# Patient Record
Sex: Female | Born: 1960 | Race: White | Hispanic: No | Marital: Married | Smoking: Current every day smoker
Health system: Southern US, Community
[De-identification: ages and names within clinical notes are randomized; demographics above are authoritative.]

---
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
## 2013-02-04 ENCOUNTER — Ambulatory Visit: Payer: Self-pay

## 2015-01-19 ENCOUNTER — Ambulatory Visit (INDEPENDENT_AMBULATORY_CARE_PROVIDER_SITE_OTHER): Payer: BLUE CROSS/BLUE SHIELD | Admitting: Family Medicine

## 2015-01-19 ENCOUNTER — Encounter: Payer: Self-pay | Admitting: Family Medicine

## 2015-01-19 VITALS — BP 124/72 | HR 62 | Temp 98.0°F | Resp 16 | Ht 62.5 in | Wt 162.8 lb

## 2015-01-19 DIAGNOSIS — J069 Acute upper respiratory infection, unspecified: Secondary | ICD-10-CM | POA: Diagnosis not present

## 2015-01-19 LAB — POCT RAPID STREP A (OFFICE): Rapid Strep A Screen: NEGATIVE

## 2015-01-19 NOTE — Patient Instructions (Signed)
Discussed use of Mucinex D for congestion, Delsym for cough, and Benadryl for postnasal drainage 

## 2015-01-19 NOTE — Progress Notes (Signed)
Subjective:     Patient ID: Raven Clark, female   DOB: 1960-07-03, 54 y.o.   MRN: [id number]  HPI  Chief Complaint  Patient presents with  . Sore Throat    Patient comes in office today with concerns of sore throat since 8/27. Patient states that pain is mostly on right side of throat more than left, patient reports pain with swallowing. Associated symptoms include: cough, stuffy nose and pain in right ear. Patient reports taking otc Sudafed and nettie pot  States her grandchildren are not ill at this time and is accompanied by her granddaughter.   Review of Systems  Constitutional: Negative for fever and chills.       Objective:   Physical Exam  Constitutional: She appears well-developed and well-nourished. No distress.  Ears: T.M's intact without inflammation Sinuses: non-tender Throat: miild tonsillar enlargement; no exudate Neck: no cervical adenopathy but mild tenderness Lungs: clear     Assessment:    1. Upper respiratory infection - POCT rapid strep A    Plan:    Discussed use of Mucinex D for congestion, Delsym for cough, and Benadryl for postnasal drainage

## 2015-02-16 ENCOUNTER — Ambulatory Visit: Payer: BLUE CROSS/BLUE SHIELD | Admitting: Family Medicine

## 2015-02-17 ENCOUNTER — Encounter: Payer: Self-pay | Admitting: Family Medicine

## 2015-02-17 ENCOUNTER — Ambulatory Visit (INDEPENDENT_AMBULATORY_CARE_PROVIDER_SITE_OTHER): Payer: BLUE CROSS/BLUE SHIELD | Admitting: Family Medicine

## 2015-02-17 VITALS — BP 132/84 | HR 66 | Temp 97.4°F | Resp 16 | Wt 163.6 lb

## 2015-02-17 DIAGNOSIS — J069 Acute upper respiratory infection, unspecified: Secondary | ICD-10-CM

## 2015-02-17 MED ORDER — CEFDINIR 300 MG PO CAPS: 300.0000 mg | ORAL_CAPSULE | Freq: Two times a day (BID) | ORAL | Status: DC

## 2015-02-17 NOTE — Patient Instructions (Signed)
Discussed use of Mucinex D, Delsym for cough, and Benadryl at night for congestion. If cough not improving over the next few days with increased sputum production, shortness of breath, or wheezing -start antibiotic

## 2015-02-17 NOTE — Progress Notes (Signed)
Subjective:     Patient ID: Raven Clark, female   DOB: 01/09/1961, 54 y.o.   MRN: [id number]  HPI  Chief Complaint  Patient presents with  . Cough    Patient comes in office today with concerns of cough and congestion for the past week. Patient states that cough has been productive of mucous and she has been taking otc Day/Night Quil. Associated symptoms include: sore throat, fatigue and low grade fever <100  States prior URI sx had resolved from earlier this month. Reports one of her young grandchildren was ill and slept with her prior to the onset of her current sx. Has not stopped smoking during this period of illness.    Review of Systems  HENT:       Clear sinus drainage without sinus pressure  Respiratory: Positive for shortness of breath (mild).        Objective:   Physical Exam  Constitutional: She appears well-developed and well-nourished. No distress.  Ears: T.M's intact without inflammation Throat: moderate tonsillar enlargement or exudate Neck: no cervical adenopathy Lungs: Basilar coarse crackles which clear with cough     Assessment:    1. Upper respiratory infection - cefdinir (OMNICEF) 300 MG capsule; Take 1 capsule (300 mg total) by mouth 2 (two) times daily.  Dispense: 20 capsule; Refill: 0    Plan:    Discussed use of Mucinex D for congestion, Delsym for cough, and Benadryl for postnasal drainage. To start abx if cough not improving over the next few days.

## 2015-02-18 ENCOUNTER — Ambulatory Visit: Payer: BLUE CROSS/BLUE SHIELD | Admitting: Family Medicine

## 2015-04-13 ENCOUNTER — Ambulatory Visit (INDEPENDENT_AMBULATORY_CARE_PROVIDER_SITE_OTHER): Payer: BLUE CROSS/BLUE SHIELD | Admitting: Family Medicine

## 2015-04-13 ENCOUNTER — Encounter: Payer: Self-pay | Admitting: Family Medicine

## 2015-04-13 DIAGNOSIS — J209 Acute bronchitis, unspecified: Secondary | ICD-10-CM | POA: Diagnosis not present

## 2015-04-13 DIAGNOSIS — R062 Wheezing: Secondary | ICD-10-CM | POA: Diagnosis not present

## 2015-04-13 MED ORDER — PREDNISONE 5 MG PO TABS: 5.0000 mg | ORAL_TABLET | Freq: Every day | ORAL | Status: DC

## 2015-04-13 MED ORDER — AZITHROMYCIN 250 MG PO TABS: ORAL_TABLET | ORAL | Status: DC

## 2015-04-13 MED ORDER — ALBUTEROL SULFATE HFA 108 (90 BASE) MCG/ACT IN AERS: 2.0000 | INHALATION_SPRAY | Freq: Four times a day (QID) | RESPIRATORY_TRACT | Status: DC | PRN

## 2015-04-13 MED ORDER — LEVALBUTEROL HCL 1.25 MG/0.5ML IN NEBU
1.2500 mg | INHALATION_SOLUTION | Freq: Once | RESPIRATORY_TRACT | Status: AC
  Administered 2015-04-13: 1.25 mg via RESPIRATORY_TRACT

## 2015-04-13 NOTE — Progress Notes (Signed)
Patient ID: Raven Clark, female   DOB: [birth year], 54 y.o.   MRN: [id number] Name: MILDA LINDVALL   MRN: [id number]    DOB: 02-21-1961   Date:04/13/2015       Progress Note  Subjective  Chief Complaint  Chief Complaint  Patient presents with  . Cough  . Fever    Cough This is a recurrent problem. The current episode started 1 to 4 weeks ago (approximately 2 weeks). The problem has been gradually worsening. The problem occurs constantly. Cough characteristics: occasional clear sputum. Associated symptoms include chest pain, a fever and rhinorrhea. Pertinent negatives include no ear pain, postnasal drip or sore throat. Associated symptoms comments: Sore ribs.. Risk factors for lung disease include smoking/tobacco exposure. She has tried OTC cough suppressant (hydrocodone - but causes headache) for the symptoms. The treatment provided mild relief.  Fever  The current episode started in the past 7 days. The problem occurs intermittently. Her temperature was unmeasured prior to arrival. Associated symptoms include chest pain and coughing. Pertinent negatives include no ear pain or sore throat. She has tried acetaminophen for the symptoms.   Past Surgical History  Procedure Laterality Date  . Foot surgery Right 1971   Family History  Problem Relation Age of Onset  . Hypertension Mother   . Dementia Father   . Parkinson's disease Father    History reviewed. No pertinent past medical history.  Social History  Substance Use Topics  . Smoking status: Current Every Day Smoker    Types: Cigarettes  . Smokeless tobacco: Not on file  . Alcohol Use: 0.6 oz/week    1 Standard drinks or equivalent per week   No current outpatient prescriptions on file.  Allergies  Allergen Reactions  . Tetracycline     brain swelling as an infant   Review of Systems  Constitutional: Positive for fever.  HENT: Positive for rhinorrhea. Negative for ear pain, postnasal drip and sore throat.   Eyes:  Negative.   Respiratory: Positive for cough.   Cardiovascular: Positive for chest pain.  Gastrointestinal: Negative.   Genitourinary: Negative.   Skin: Negative.   Neurological: Negative.   Endo/Heme/Allergies: Negative.   Psychiatric/Behavioral: Negative.    Objective  Filed Vitals:   04/13/15 1142  BP: 122/64  Pulse: 95  Temp: 98.7 F (37.1 C)  TempSrc: Oral  Resp: 18  Weight: 162 lb 3.2 oz (73.573 kg)  SpO2: 95%   Physical Exam  Constitutional: She is oriented to person, place, and time and well-developed, well-nourished, and in no distress.  HENT:  Head: Normocephalic and atraumatic.  Right Ear: External ear normal.  Left Ear: External ear normal.  Large tonsils but no redness or exudates.  Eyes: Conjunctivae and EOM are normal.  Neck: Normal range of motion. Neck supple.  Cardiovascular: Normal rate, regular rhythm and normal heart sounds.   Pulmonary/Chest: She has wheezes. She exhibits tenderness.  Rhonchi scattered.  Abdominal: Soft. Bowel sounds are normal.  Neurological: She is alert and oriented to person, place, and time.  Skin: No rash noted.  Psychiatric: Affect and judgment normal.    Assessment & Plan  1. Acute bronchitis, unspecified organism Onset with cough and congestion over the past 2 weeks with the onset of fever a week ago. Wheeze and rhonchi in chest to auscultation. Given nebulizer treatment with Xopenex and started prednisone with Z-pak. Increase fluid intake and may continue Mucinex with Delsym prn cough. Recheck in 5-7 days if no better. - predniSONE (DELTASONE) 5 MG  tablet; Take 1 tablet (5 mg total) by mouth daily with breakfast. Taper dose daily over the next 6 days (6,5,4,3,2,1)  Dispense: 21 tablet; Refill: 0 - azithromycin (ZITHROMAX) 250 MG tablet; Two tablets today by mouth then one daily for 4 days.  Dispense: 6 tablet; Refill: 0 - levalbuterol (XOPENEX) nebulizer solution 1.25 mg; Take 1.25 mg by nebulization once.  2.  Wheeze Scattered and improved with nebulizer treatment. Will add prednisone taper and Proventil prn rescue from wheezing. Should go to ER if worsening and not controlled by this regimen. - albuterol (PROVENTIL HFA;VENTOLIN HFA) 108 (90 BASE) MCG/ACT inhaler; Inhale 2 puffs into the lungs every 6 (six) hours as needed for wheezing or shortness of breath.  Dispense: 1 Inhaler; Refill: 2 - levalbuterol (XOPENEX) nebulizer solution 1.25 mg; Take 1.25 mg by nebulization once.

## 2016-03-23 ENCOUNTER — Encounter: Payer: Self-pay | Admitting: Family Medicine

## 2016-03-23 ENCOUNTER — Ambulatory Visit (INDEPENDENT_AMBULATORY_CARE_PROVIDER_SITE_OTHER): Payer: BLUE CROSS/BLUE SHIELD | Admitting: Family Medicine

## 2016-03-23 VITALS — BP 102/70 | HR 77 | Temp 98.4°F | Resp 17 | Wt 162.0 lb

## 2016-03-23 DIAGNOSIS — B349 Viral infection, unspecified: Secondary | ICD-10-CM

## 2016-03-23 MED ORDER — CEFDINIR 300 MG PO CAPS: 300.0000 mg | ORAL_CAPSULE | Freq: Two times a day (BID) | ORAL | 0 refills | Status: DC

## 2016-03-23 MED ORDER — ALBUTEROL SULFATE HFA 108 (90 BASE) MCG/ACT IN AERS: 2.0000 | INHALATION_SPRAY | Freq: Four times a day (QID) | RESPIRATORY_TRACT | 2 refills | Status: DC | PRN

## 2016-03-23 NOTE — Progress Notes (Signed)
Subjective:     Patient ID: Jackqulyn LivingsJennifer L Chalfin, female   DOB: 1960-10-21, 55 y.o.   MRN: [id number]  HPI  Chief Complaint  Patient presents with  . Cough    Patient comes in office today with concerns of cough and congestion for the past 2.5 weeks. Patient reports following symptoms: wheezing, runny nose, pain in joints, sinus pressure right side only amd fatigue. Paitent reports taking otc Xicam and Vicks, last night paitent states she took an old Rx for Hydrocodone.   Reports waxing and waning symptoms with clear sinus congestion and sputum She has diminished smoking while ill. States she has left over narcotic cough syrup at home. She takes care of her young grandchildren 4 x week. Has previously used albuterol MDI when ill.   Review of Systems     Objective:   Physical Exam  Constitutional: She appears well-developed and well-nourished. She appears distressed (mild distress from frequent congested cough.).  Ears: T.M's intact without inflammation Sinuses: non-tender Throat: moderate tonsillar enlargement without erythema or exudate Neck: anterior cervical tenderness. Lungs: clear/ deep inspiration provokes cough     Assessment:    1. Acute viral syndrome - cefdinir (OMNICEF) 300 MG capsule; Take 1 capsule (300 mg total) by mouth 2 (two) times daily.  Dispense: 14 capsule; Refill: 0 - albuterol (PROVENTIL HFA;VENTOLIN HFA) 108 (90 Base) MCG/ACT inhaler; Inhale 2 puffs into the lungs every 6 (six) hours as needed for wheezing or shortness of breath.  Dispense: 1 Inhaler; Refill: 2    Plan:    Discussed use of Mucinex D and Delsym. Schedule albuterol MDI. If not improving over the next two days or sees purulent sputum to start abx.

## 2016-03-23 NOTE — Patient Instructions (Addendum)
Discussed use of Mucinex D, Delsym and minimize smoking while ill. Schedule albuterol at least twice daily while ill.

## 2016-03-31 ENCOUNTER — Ambulatory Visit (INDEPENDENT_AMBULATORY_CARE_PROVIDER_SITE_OTHER): Payer: BLUE CROSS/BLUE SHIELD | Admitting: Family Medicine

## 2016-03-31 ENCOUNTER — Encounter: Payer: Self-pay | Admitting: Family Medicine

## 2016-03-31 VITALS — BP 118/82 | HR 72 | Temp 97.9°F | Resp 16 | Wt 165.0 lb

## 2016-03-31 DIAGNOSIS — S80862A Insect bite (nonvenomous), left lower leg, initial encounter: Secondary | ICD-10-CM

## 2016-03-31 DIAGNOSIS — W57XXXA Bitten or stung by nonvenomous insect and other nonvenomous arthropods, initial encounter: Secondary | ICD-10-CM | POA: Diagnosis not present

## 2016-03-31 NOTE — Progress Notes (Signed)
Subjective:     Patient ID: Raven Clark, female   DOB: 02-12-1961, 55 y.o.   MRN: [id number]  HPI  Chief Complaint  Patient presents with  . Insect Bite    Pt found tick on her left leg on Monday. Tick came off of dog. Pt states, "it wasn't a deer tick, it was a dog tick". Pt reports she has a "target" on her leg. Pt denies fever, joint aches. Does admit to headache.  States she used vaseline then removed the tick with tweezers. States it have been on her < 24 hours. Reports sinus infection has improved with use of abx. She is accompanied by her two grandchildren today.   Review of Systems     Objective:   Physical Exam  Constitutional: She appears well-developed and well-nourished. No distress.  Skin:  Left lower leg with 1 cm, flat, annular area of erythema c/w hemorrhage from tick removal. No tenderness, drainage, or foreign bodies visualized.       Assessment:    1. Tick bite of left lower leg, initial encounter     Plan:    Cleanse with soap and water and apply hydrocortisone as needed for itching. Discussed s & s of tick fever.

## 2016-03-31 NOTE — Patient Instructions (Signed)
Clean with soap and water daily. May use hydrocortisone cream for itching.

## 2017-06-07 ENCOUNTER — Other Ambulatory Visit: Payer: Self-pay

## 2017-06-07 ENCOUNTER — Emergency Department: Payer: No Typology Code available for payment source

## 2017-06-07 ENCOUNTER — Emergency Department
Admission: EM | Admit: 2017-06-07 | Discharge: 2017-06-07 | Disposition: A | Discharge status: Home / Self Care | Payer: No Typology Code available for payment source | Attending: Emergency Medicine | Admitting: Emergency Medicine

## 2017-06-07 DIAGNOSIS — Y9389 Activity, other specified: Secondary | ICD-10-CM | POA: Diagnosis not present

## 2017-06-07 DIAGNOSIS — F17210 Nicotine dependence, cigarettes, uncomplicated: Secondary | ICD-10-CM | POA: Diagnosis not present

## 2017-06-07 DIAGNOSIS — S29012A Strain of muscle and tendon of back wall of thorax, initial encounter: Secondary | ICD-10-CM | POA: Diagnosis not present

## 2017-06-07 DIAGNOSIS — Y92410 Unspecified street and highway as the place of occurrence of the external cause: Secondary | ICD-10-CM | POA: Diagnosis not present

## 2017-06-07 DIAGNOSIS — S299XXA Unspecified injury of thorax, initial encounter: Secondary | ICD-10-CM | POA: Diagnosis present

## 2017-06-07 DIAGNOSIS — M25572 Pain in left ankle and joints of left foot: Secondary | ICD-10-CM | POA: Insufficient documentation

## 2017-06-07 DIAGNOSIS — Y999 Unspecified external cause status: Secondary | ICD-10-CM | POA: Diagnosis not present

## 2017-06-07 DIAGNOSIS — S8011XA Contusion of right lower leg, initial encounter: Secondary | ICD-10-CM | POA: Insufficient documentation

## 2017-06-07 DIAGNOSIS — S29019A Strain of muscle and tendon of unspecified wall of thorax, initial encounter: Secondary | ICD-10-CM

## 2017-06-07 MED ORDER — IBUPROFEN 600 MG PO TABS
ORAL_TABLET | ORAL | Status: AC
  Filled 2017-06-07: qty 1

## 2017-06-07 MED ORDER — CYCLOBENZAPRINE HCL 5 MG PO TABS: 5.0000 mg | ORAL_TABLET | Freq: Three times a day (TID) | ORAL | 0 refills | Status: DC | PRN

## 2017-06-07 MED ORDER — IBUPROFEN 600 MG PO TABS
600.0000 mg | ORAL_TABLET | Freq: Once | ORAL | Status: AC
  Administered 2017-06-07: 600 mg via ORAL
  Filled 2017-06-07: qty 1
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
## 2017-06-07 NOTE — ED Notes (Signed)
Patient transported to X-ray 
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
## 2017-11-14 ENCOUNTER — Ambulatory Visit
Admission: RE | Admit: 2017-11-14 | Discharge: 2017-11-14 | Disposition: A | Discharge status: Home / Self Care | Payer: 59 | Source: Ambulatory Visit | Attending: Family Medicine | Admitting: Family Medicine

## 2017-11-14 ENCOUNTER — Other Ambulatory Visit: Payer: Self-pay | Admitting: Family Medicine

## 2017-11-14 ENCOUNTER — Ambulatory Visit: Payer: 59 | Admitting: Family Medicine

## 2017-11-14 ENCOUNTER — Encounter: Payer: Self-pay | Admitting: Family Medicine

## 2017-11-14 VITALS — BP 120/76 | HR 100 | Temp 100.0°F | Resp 18 | Wt 147.4 lb

## 2017-11-14 DIAGNOSIS — J209 Acute bronchitis, unspecified: Secondary | ICD-10-CM | POA: Diagnosis not present

## 2017-11-14 DIAGNOSIS — R509 Fever, unspecified: Secondary | ICD-10-CM | POA: Insufficient documentation

## 2017-11-14 DIAGNOSIS — R918 Other nonspecific abnormal finding of lung field: Secondary | ICD-10-CM | POA: Insufficient documentation

## 2017-11-14 DIAGNOSIS — R05 Cough: Secondary | ICD-10-CM | POA: Insufficient documentation

## 2017-11-14 MED ORDER — AZITHROMYCIN 250 MG PO TABS: ORAL_TABLET | ORAL | 0 refills | Status: DC

## 2017-11-14 MED ORDER — PREDNISONE 20 MG PO TABS: ORAL_TABLET | ORAL | 0 refills | Status: DC

## 2017-11-14 NOTE — Patient Instructions (Signed)
We will call you with the x-ray report. Continue albuterol and expectorant.

## 2017-11-14 NOTE — Progress Notes (Signed)
  Subjective:     Patient ID: Raven Clark, female   DOB: 27-Apr-1961, 57 y.o.   MRN: [id number] Chief Complaint  Patient presents with  . Cough    Patient comes in office today with complaints of cough and congestion for one week. Pateint complaints of fever high of 100.6, body/muscle aches, cough fproductive of mucous, fullness in left ear, shortness of breath, wheezing and decreased appetite. Patient reports that she is taking otc Delsym 12hr, Advil, Tylenol and Sudafed; patient reports that she has used Proventil for shortness of breath.    HPI Reports increased congested cough and persistent fevers. Not able to characterize her sputum. Sinus drainage is clear. Accompanied by her two granddaughters. + tobacco use. Patient used albuterol prior to visit today. Review of Systems     Objective:   Physical Exam  Constitutional: She appears well-developed and well-nourished. She appears distressed (mild distress from frequent cough).  Ears: T.M's intact without inflammation Throat: moderate tonsillar enlargement without exudate Neck: no cervical adenopathy Lungs: posterior expiratory crackles with inspiration provoking cough.     Assessment:    1. Acute bronchitis, unspecified organism: r/o pneumonia: start Z-Pack - DG Chest 2 View; Future    Plan:    Further f/u pending CXR report. Continue albuterol and expectorant.

## 2017-11-15 ENCOUNTER — Telehealth: Payer: Self-pay

## 2017-11-15 NOTE — Telephone Encounter (Signed)
-----   Message from Anola Gurneyobert Chauvin, [place] sent at 11/15/2017  7:27 AM EDT ----- Probable left lower lower lobe pneumonia. Will need to call for follow up chest x-ray in 3-4 weeks or sooner if not improving.

## 2017-11-15 NOTE — Telephone Encounter (Signed)
Patient advised.KW 

## 2018-06-28 ENCOUNTER — Other Ambulatory Visit: Payer: Self-pay

## 2018-06-28 ENCOUNTER — Other Ambulatory Visit: Payer: Self-pay | Admitting: Family Medicine

## 2018-06-28 ENCOUNTER — Ambulatory Visit (INDEPENDENT_AMBULATORY_CARE_PROVIDER_SITE_OTHER): Payer: 59 | Admitting: Family Medicine

## 2018-06-28 ENCOUNTER — Encounter: Payer: Self-pay | Admitting: Family Medicine

## 2018-06-28 VITALS — BP 142/90 | HR 92 | Temp 99.7°F | Ht 62.5 in | Wt 150.2 lb

## 2018-06-28 DIAGNOSIS — B349 Viral infection, unspecified: Secondary | ICD-10-CM

## 2018-06-28 DIAGNOSIS — Z20828 Contact with and (suspected) exposure to other viral communicable diseases: Secondary | ICD-10-CM

## 2018-06-28 LAB — POCT INFLUENZA A/B
Influenza A, POC: NEGATIVE
Influenza B, POC: NEGATIVE

## 2018-06-28 MED ORDER — OSELTAMIVIR PHOSPHATE 75 MG PO CAPS: 75.0000 mg | ORAL_CAPSULE | Freq: Two times a day (BID) | ORAL | 0 refills | Status: DC

## 2018-06-28 NOTE — Progress Notes (Signed)
  Subjective:     Patient ID: PALMIRA HIRSCHI, female   DOB: 21-May-1961, 58 y.o.   MRN: [id number] Chief Complaint  Patient presents with  . flu like symptoms    fever, chills, no appitite, coughing, sinus drainage, since 06/27/18   HPI Reports household exposure to the flu and now has flu sx with temp up to 102. No flu shot. + tobacco use. Accompanied by her mother today.  Review of Systems     Objective:   Physical Exam Constitutional:      General: She is not in acute distress.    Appearance: She is ill-appearing.  Neurological:     Mental Status: She is alert.   Ears: T.M's intact without inflammation Throat: mild tonsillar enlargement without erythema or exudate. Neck: no cervical adenopathy Lungs: clear     Assessment:    1. Exposure to the flu - POCT Influenza A/B - oseltamivir (TAMIFLU) 75 MG capsule; Take 1 capsule (75 mg total) by mouth 2 (two) times daily.  Dispense: 10 capsule; Refill: 0  2. Acute viral syndrome    Plan:    Discussed use of Mucinex and Delsym. Stop smoking while ill.

## 2018-06-28 NOTE — Patient Instructions (Addendum)
Discussed use of Mucinex and Delsym. No smoking while ill.

## 2019-01-24 ENCOUNTER — Telehealth: Payer: Self-pay | Admitting: Family Medicine

## 2019-01-24 NOTE — Telephone Encounter (Signed)
I can work her in this afternoon at 4 or earlier if she can get here.  Otherwise, we can check and see if Tawanna Sat could squeeze it in tomorrow

## 2019-01-24 NOTE — Telephone Encounter (Signed)
Patient called stating she has "red nodules that are big as dimes popping up behind her ear, in front of her ear, at nostril, on forehead. There is one on her eyelid that has almost swollen eye shut.  She thought at first that she was bit by something but they are continuing to "come out" periodically.    Not sure where to put her for an appt.    Please advise patient. She said she cannot come today.

## 2019-01-24 NOTE — Telephone Encounter (Signed)
Patient can not come in today. Raven Clark said she can work her in at 11:20 tomorrow.

## 2019-01-25 ENCOUNTER — Encounter: Payer: Self-pay | Admitting: Physician Assistant

## 2019-01-25 ENCOUNTER — Ambulatory Visit: Payer: BLUE CROSS/BLUE SHIELD | Admitting: Physician Assistant

## 2019-01-25 ENCOUNTER — Other Ambulatory Visit: Payer: Self-pay

## 2019-01-25 VITALS — BP 153/88 | HR 64 | Temp 97.1°F | Wt 156.0 lb

## 2019-01-25 DIAGNOSIS — L0100 Impetigo, unspecified: Secondary | ICD-10-CM

## 2019-01-25 DIAGNOSIS — B029 Zoster without complications: Secondary | ICD-10-CM

## 2019-01-25 MED ORDER — MUPIROCIN 2 % EX OINT: 1.0000 "application " | TOPICAL_OINTMENT | Freq: Two times a day (BID) | CUTANEOUS | 0 refills | Status: AC

## 2019-01-25 MED ORDER — MUPIROCIN 2 % EX OINT: 1.0000 "application " | TOPICAL_OINTMENT | Freq: Two times a day (BID) | CUTANEOUS | 0 refills | Status: DC

## 2019-01-25 MED ORDER — VALACYCLOVIR HCL 1 G PO TABS: 1000.0000 mg | ORAL_TABLET | Freq: Three times a day (TID) | ORAL | 0 refills | Status: DC

## 2019-01-25 NOTE — Progress Notes (Signed)
Patient: Raven Clark Female    DOB: 16-Nov-1960   58 y.o.   MRN: [id number] Visit Date: 01/25/2019  Today's Provider: Mar Daring, PA-C   Chief Complaint  Patient presents with  . Rash    Started last week   Subjective:     Rash This is a new problem. The current episode started in the past 7 days. The problem has been gradually worsening since onset. The affected locations include the face and scalp. The rash is characterized by itchiness, swelling and pain. She was exposed to nothing. Pertinent negatives include no anorexia, congestion, cough, diarrhea, eye pain, facial edema, fatigue, fever, joint pain, nail changes, rhinorrhea, shortness of breath, sore throat or vomiting.   Patient reports that Sunday she noticed her hair line to the left was itchy, and felt like something was crawling in her hair. Then on Monday she noticed having a large red spot on the left side of her nose. Then Tuesday she noticed her eyelid was more swollen and she had a bump on her left eyelid. The lesions have been itchy. Then yesterday she had 3 lesions devleop on her forehead to the left of midline. She reports initially thinking these were poison oak because they had a "blistered" appearance. She also noticed having a "bump under the skin" in front of the left ear and under the left side of the jaw.   Allergies  Allergen Reactions  . Tetracycline     brain swelling as an infant     Current Outpatient Medications:  .  ELDERBERRY PO, Take by mouth every morning. gummies, Disp: , Rfl:  .  oseltamivir (TAMIFLU) 75 MG capsule, Take 1 capsule (75 mg total) by mouth 2 (two) times daily., Disp: 10 capsule, Rfl: 0  Review of Systems  Constitutional: Negative.  Negative for fatigue and fever.  HENT: Negative for congestion, rhinorrhea and sore throat.   Eyes: Negative for photophobia, pain, discharge, redness, itching and visual disturbance.  Respiratory: Negative.  Negative for cough and  shortness of breath.   Gastrointestinal: Negative for anorexia, diarrhea and vomiting.  Musculoskeletal: Negative for joint pain.  Skin: Positive for rash. Negative for color change, nail changes, pallor and wound.    Social History   Tobacco Use  . Smoking status: Current Every Day Smoker    Types: Cigarettes  . Smokeless tobacco: Never Used  Substance Use Topics  . Alcohol use: Yes    Alcohol/week: 1.0 standard drinks    Types: 1 Standard drinks or equivalent per week      Objective:   BP (!) 153/88 (BP Location: Right Arm, Patient Position: Sitting, Cuff Size: Normal)   Pulse 64   Temp (!) 97.1 F (36.2 C) (Temporal)   Wt 156 lb (70.8 kg)   BMI 28.08 kg/m  Vitals:   01/25/19 1119  BP: (!) 153/88  Pulse: 64  Temp: (!) 97.1 F (36.2 C)  TempSrc: Temporal  Weight: 156 lb (70.8 kg)  Body mass index is 28.08 kg/m.   Physical Exam Vitals signs reviewed.  Constitutional:      General: She is not in acute distress.    Appearance: Normal appearance. She is well-developed. She is not ill-appearing or diaphoretic.  Neck:     Musculoskeletal: Normal range of motion and neck supple. No neck rigidity or muscular tenderness.     Thyroid: No thyromegaly.     Vascular: No JVD.     Trachea: No tracheal deviation.  Cardiovascular:     Rate and Rhythm: Normal rate and regular rhythm.     Pulses: Normal pulses.     Heart sounds: Normal heart sounds. No murmur. No friction rub. No gallop.   Pulmonary:     Effort: Pulmonary effort is normal. No respiratory distress.     Breath sounds: Normal breath sounds. No wheezing or rales.  Lymphadenopathy:     Head:     Left side of head: Preauricular and posterior auricular adenopathy present.     Cervical: Cervical adenopathy present.     Left cervical: Superficial cervical adenopathy present.  Skin:    Findings: Rash present. Rash is papular and vesicular.       Neurological:     Mental Status: She is alert.      No results  found for any visits on 01/25/19.     Assessment & Plan    1. Herpes zoster without complication Suspect shingles. Will treat with Valtrex as below. Warning signs for eye involvement discussed and advised to go to ER if they develop. She agrees. Call if rash does not improve.  - valACYclovir (VALTREX) 1000 MG tablet; Take 1 tablet (1,000 mg total) by mouth 3 (three) times daily.  Dispense: 21 tablet; Refill: 0  2. Impetigo Suspect secondary impetigo on the lesion on the nose. Will give mupirocin ointment as below.  - mupirocin ointment (BACTROBAN) 2 %; Apply 1 application topically 2 (two) times daily.  Dispense: 22 g; Refill: 0     Margaretann LovelessJennifer M , PA-C  [facility]

## 2019-01-25 NOTE — Patient Instructions (Signed)
Shingles  Shingles, which is also known as herpes zoster, is an infection that causes a painful skin rash and fluid-filled blisters. It is caused by a virus. Shingles only develops in people who:  Have had chickenpox.  Have been given a medicine to protect against chickenpox (have been vaccinated). Shingles is rare in this group. What are the causes? Shingles is caused by varicella-zoster virus (VZV). This is the same virus that causes chickenpox. After a person is exposed to VZV, the virus stays in the body in an inactive (dormant) state. Shingles develops if the virus is reactivated. This can happen many years after the first (initial) exposure to VZV. It is not known what causes this virus to be reactivated. What increases the risk? People who have had chickenpox or received the chickenpox vaccine are at risk for shingles. Shingles infection is more common in people who:  Are older than age 60.  Have a weakened disease-fighting system (immune system), such as people with: ? HIV. ? AIDS. ? Cancer.  Are taking medicines that weaken the immune system, such as transplant medicines.  Are experiencing a lot of stress. What are the signs or symptoms? Early symptoms of this condition include itching, tingling, and pain in an area on your skin. Pain may be described as burning, stabbing, or throbbing. A few days or weeks after early symptoms start, a painful red rash appears. The rash is usually on one side of the body and has a band-like or belt-like pattern. The rash eventually turns into fluid-filled blisters that break open, change into scabs, and dry up in about 2-3 weeks. At any time during the infection, you may also develop:  A fever.  Chills.  A headache.  An upset stomach. How is this diagnosed? This condition is diagnosed with a skin exam. Skin or fluid samples may be taken from the blisters before a diagnosis is made. These samples are examined under a microscope or sent to  a lab for testing. How is this treated? The rash may last for several weeks. There is not a specific cure for this condition. Your health care provider will probably prescribe medicines to help you manage pain, recover more quickly, and avoid long-term problems. Medicines may include:  Antiviral drugs.  Anti-inflammatory drugs.  Pain medicines.  Anti-itching medicines (antihistamines). If the area involved is on your face, you may be referred to a specialist, such as an eye doctor (ophthalmologist) or an ear, nose, and throat (ENT) doctor (otolaryngologist) to help you avoid eye problems, chronic pain, or disability. Follow these instructions at home: Medicines  Take over-the-counter and prescription medicines only as told by your health care provider.  Apply an anti-itch cream or numbing cream to the affected area as told by your health care provider. Relieving itching and discomfort   Apply cold, wet cloths (cold compresses) to the area of the rash or blisters as told by your health care provider.  Cool baths can be soothing. Try adding baking soda or dry oatmeal to the water to reduce itching. Do not bathe in hot water. Blister and rash care  Keep your rash covered with a loose bandage (dressing). Wear loose-fitting clothing to help ease the pain of material rubbing against the rash.  Keep your rash and blisters clean by washing the area with mild soap and cool water as told by your health care provider.  Check your rash every day for signs of infection. Check for: ? More redness, swelling, or pain. ? Fluid   or blood. ? Warmth. ? Pus or a bad smell.  Do not scratch your rash or pick at your blisters. To help avoid scratching: ? Keep your fingernails clean and cut short. ? Wear gloves or mittens while you sleep, if scratching is a problem. General instructions  Rest as told by your health care provider.  Keep all follow-up visits as told by your health care provider. This  is important.  Wash your hands often with soap and water. If soap and water are not available, use hand sanitizer. Doing this lowers your chance of getting a bacterial skin infection.  Before your blisters change into scabs, your shingles infection can cause chickenpox in people who have never had it or have never been vaccinated against it. To prevent this from happening, avoid contact with other people, especially: ? Babies. ? Pregnant women. ? Children who have eczema. ? Elderly people who have transplants. ? People who have chronic illnesses, such as cancer or AIDS. Contact a health care provider if:  Your pain is not relieved with prescribed medicines.  Your pain does not get better after the rash heals.  You have signs of infection in the rash area, such as: ? More redness, swelling, or pain around the rash. ? Fluid or blood coming from the rash. ? The rash area feeling warm to the touch. ? Pus or a bad smell coming from the rash. Get help right away if:  The rash is on your face or nose.  You have facial pain, pain around your eye area, or loss of feeling on one side of your face.  You have difficulty seeing.  You have ear pain or have ringing in your ear.  You have a loss of taste.  Your condition gets worse. Summary  Shingles, which is also known as herpes zoster, is an infection that causes a painful skin rash and fluid-filled blisters.  This condition is diagnosed with a skin exam. Skin or fluid samples may be taken from the blisters and examined before the diagnosis is made.  Keep your rash covered with a loose bandage (dressing). Wear loose-fitting clothing to help ease the pain of material rubbing against the rash.  Before your blisters change into scabs, your shingles infection can cause chickenpox in people who have never had it or have never been vaccinated against it. This information is not intended to replace advice given to you by your health care  provider. Make sure you discuss any questions you have with your health care provider. Document Released: 05/09/2005 Document Revised: 08/31/2018 Document Reviewed: 01/11/2017 Elsevier Patient Education  2020 Elsevier Inc.  
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
## 2019-05-29 ENCOUNTER — Other Ambulatory Visit: Payer: Self-pay | Admitting: Physician Assistant

## 2019-05-29 ENCOUNTER — Encounter: Payer: Self-pay | Admitting: Physician Assistant

## 2019-05-29 DIAGNOSIS — B029 Zoster without complications: Secondary | ICD-10-CM

## 2019-05-30 MED ORDER — VALACYCLOVIR HCL 1 G PO TABS: 1000.0000 mg | ORAL_TABLET | Freq: Three times a day (TID) | ORAL | 0 refills | Status: DC

## 2019-08-09 IMAGING — CR DG CHEST 2V
1 series · 2 of 2 positions shown · non-contrast
Comparison: Chest x-ray of 02/04/2013

CLINICAL DATA: Worsening cough, fever, smoking history

EXAM:
CHEST - 2 VIEW

[Series 1: dg chest 2 view · 0.14mm/px · 2 of 2 slices shown]
[im 1/2]
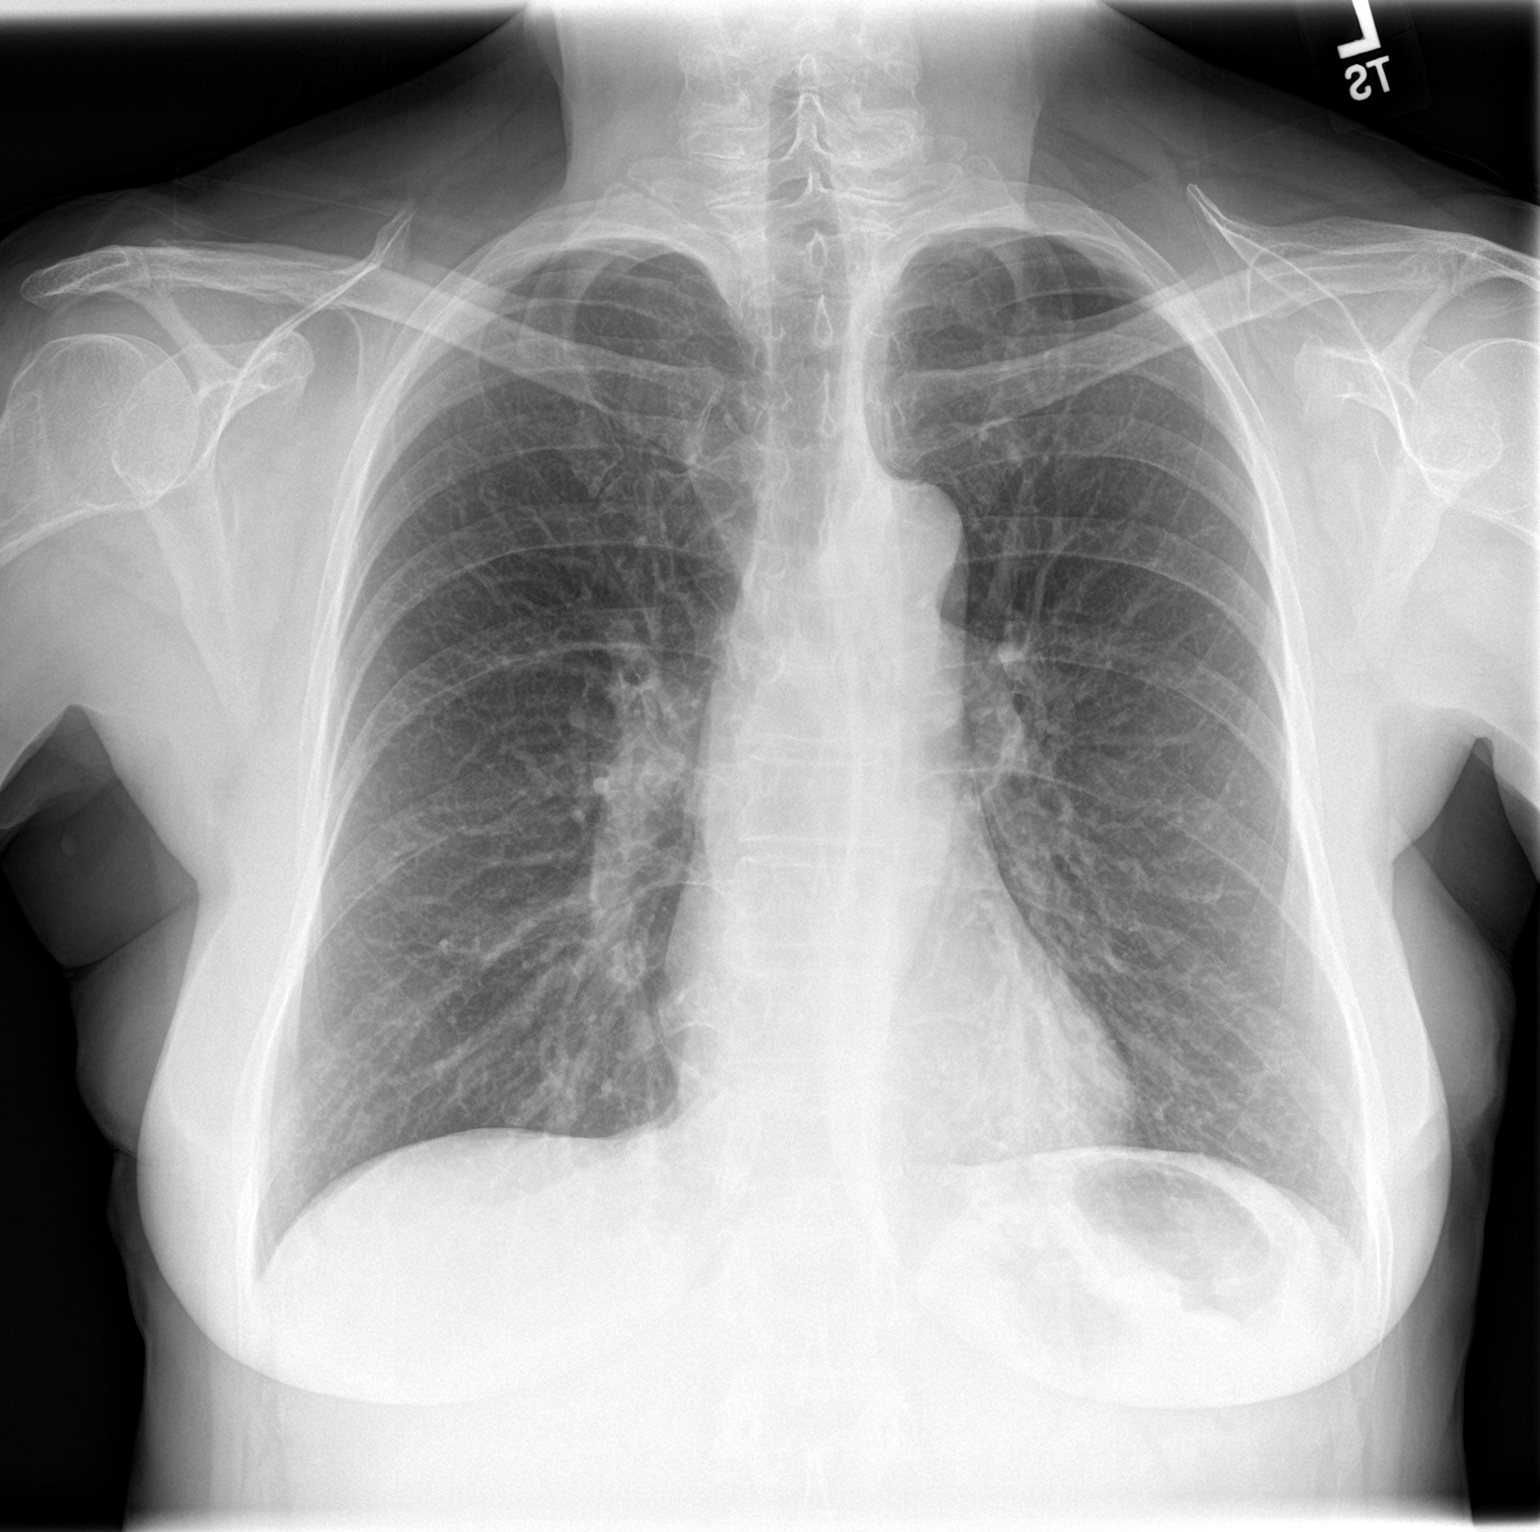
[im 2/2]
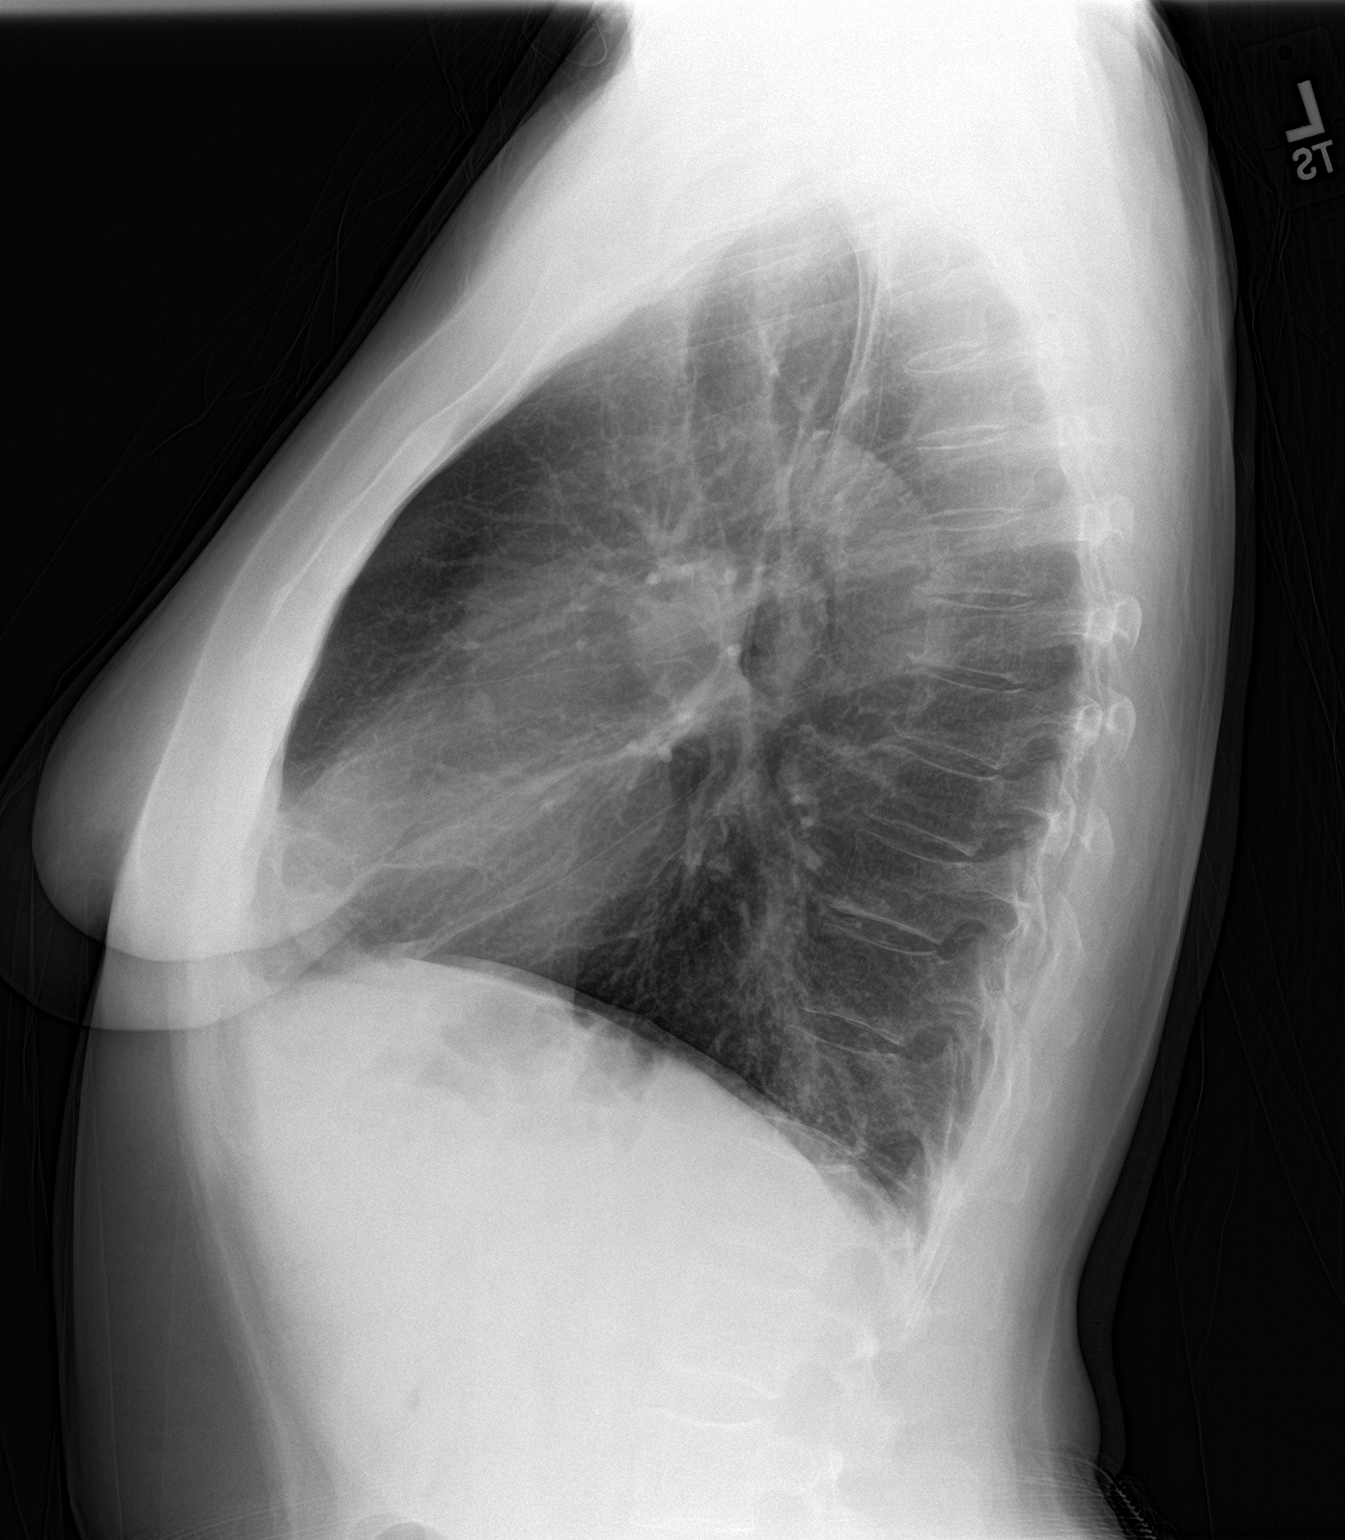

[2 of 2 positions shown; findings below may reference images not displayed]

FINDINGS: There is vague opacity medially at the left lung base overlying the
heart shadow which appears to involve the left lower lobe on lateral
view and may represent a patchy focus of pneumonia. The remainder of
the lungs are clear. No pleural effusion is seen. Mediastinal and
hilar contours are unremarkable. The heart is within normal limits
in size. No acute bony abnormality is seen.
IMPRESSION: Patchy parenchymal opacity in the medial left lower lobe suspicious
for pneumonia. Recommend follow-up.

## 2020-01-24 ENCOUNTER — Telehealth: Payer: Self-pay

## 2020-01-24 NOTE — Telephone Encounter (Signed)
Pt. Asking for COVID 19 testing site information. Given [facility] and urgent care information.

## 2020-02-19 ENCOUNTER — Telehealth: Payer: Self-pay

## 2020-02-19 NOTE — Telephone Encounter (Signed)
Appointment made

## 2020-02-19 NOTE — Telephone Encounter (Signed)
Copied from CRM [id number]. Topic: Appointment Scheduling - Scheduling Inquiry for Clinic >> Feb 19, 2020 10:28 AM Wyonia Hough E wrote: Reason for CRM: Pt has a cough and sinus drainage and would like a covid test at the office/ please advise

## 2020-02-19 NOTE — Progress Notes (Signed)
MyChart Video Visit    Virtual Visit via Video Note   This visit type was conducted due to national recommendations for restrictions regarding the COVID-19 Pandemic (e.g. social distancing) in an effort to limit this patient's exposure and mitigate transmission in our community. This patient is at least at moderate risk for complications without adequate follow up. This format is felt to be most appropriate for this patient at this time. Physical exam was limited by quality of the video and audio technology used for the visit.   Patient location: Home Provider location: Office   I discussed the limitations of evaluation and management by telemedicine and the availability of in person appointments. The patient expressed understanding and agreed to proceed.  Patient: Raven Clark   DOB: May 29, 1960   59 y.o. Female  MRN: [id number] Visit Date: 02/20/2020  Today's healthcare provider: Trey Sailors, PA-C   Chief Complaint  Patient presents with  . Cough  I,Porsha C McClurkin,acting as a scribe for Trey Sailors, PA-C.,have documented all relevant documentation on the behalf of Trey Sailors, PA-C,as directed by  Trey Sailors, PA-C while in the presence of Trey Sailors, PA-C.  Subjective    Cough This is a new problem. The current episode started 1 to 4 weeks ago. The problem has been unchanged. The problem occurs constantly. The cough is productive of sputum. Associated symptoms include headaches, nasal congestion, postnasal drip, rhinorrhea, a sore throat, shortness of breath and wheezing. Pertinent negatives include no chills or fever. The symptoms are aggravated by exercise and other. Treatments tried: nasal spray,Tussin DM max. The treatment provided mild relief. Her past medical history is significant for bronchitis and pneumonia.   She reports about a month ago she had URI symptoms. She reports sick contacts in her grandchildren. She reports she tried allergies,  nettipot which did not work. She now has a dry upper cough. She reports she has a cough in her upper chest. She does have some occasional wheezing. Reports her brother called her 02/19/2020 to report close contact with COVID positive. She has been vaccinated with second dose June 25.      Medications: Outpatient Medications Prior to Visit  Medication Sig  . ELDERBERRY PO Take by mouth every morning. gummies  . mupirocin ointment (BACTROBAN) 2 % Apply 1 application topically 2 (two) times daily.  . valACYclovir (VALTREX) 1000 MG tablet Take 1 tablet (1,000 mg total) by mouth 3 (three) times daily.   No facility-administered medications prior to visit.    Review of Systems  Constitutional: Negative for chills, fatigue and fever.  HENT: Positive for congestion, postnasal drip, rhinorrhea, sinus pressure, sinus pain, sneezing and sore throat.   Respiratory: Positive for cough, shortness of breath and wheezing.   Neurological: Positive for headaches. Negative for weakness.      Objective    There were no vitals taken for this visit.   Physical Exam Constitutional:      Appearance: Normal appearance.  Pulmonary:     Effort: Pulmonary effort is normal. No respiratory distress.  Neurological:     Mental Status: She is alert.  Psychiatric:        Mood and Affect: Mood normal.        Behavior: Behavior normal.        Assessment & Plan    1. Suspected COVID-19 virus infection  - Novel Coronavirus, NAA ([facility])  2. Bronchitis  - Novel Coronavirus, NAA ([facility]) - predniSONE (DELTASONE) 10  MG tablet; Take 6 pills on day 1, 5 pills on day 2 and so on until complete.  Dispense: 21 tablet; Refill: 0 - albuterol (VENTOLIN HFA) 108 (90 Base) MCG/ACT inhaler; Inhale 2 puffs into the lungs every 6 (six) hours as needed for wheezing or shortness of breath.  Dispense: 1 each; Refill: 2   No follow-ups on file.     I discussed the assessment and treatment plan with the patient. The  patient was provided an opportunity to ask questions and all were answered. The patient agreed with the plan and demonstrated an understanding of the instructions.   The patient was advised to call back or seek an in-person evaluation if the symptoms worsen or if the condition fails to improve as anticipated.   ITrey Sailors, PA-C, have reviewed all documentation for this visit. The documentation on 02/20/20 for the exam, diagnosis, procedures, and orders are all accurate and complete.  The entirety of the information documented in the History of Present Illness, Review of Systems and Physical Exam were personally obtained by me. Portions of this information were initially documented by [facility] and reviewed by me for thoroughness and accuracy.    Maryella Shivers [facility] [phone] (phone) [phone] (fax)  [facility]

## 2020-02-20 ENCOUNTER — Telehealth: Payer: Self-pay | Admitting: Family Medicine

## 2020-02-20 ENCOUNTER — Telehealth (INDEPENDENT_AMBULATORY_CARE_PROVIDER_SITE_OTHER): Payer: BLUE CROSS/BLUE SHIELD | Admitting: Physician Assistant

## 2020-02-20 DIAGNOSIS — J40 Bronchitis, not specified as acute or chronic: Secondary | ICD-10-CM | POA: Diagnosis not present

## 2020-02-20 DIAGNOSIS — Z20822 Contact with and (suspected) exposure to covid-19: Secondary | ICD-10-CM | POA: Diagnosis not present

## 2020-02-20 MED ORDER — PREDNISONE 10 MG PO TABS: ORAL_TABLET | ORAL | 0 refills | Status: DC

## 2020-02-20 MED ORDER — ALBUTEROL SULFATE HFA 108 (90 BASE) MCG/ACT IN AERS: 2.0000 | INHALATION_SPRAY | Freq: Four times a day (QID) | RESPIRATORY_TRACT | 2 refills | Status: DC | PRN

## 2020-02-22 LAB — NOVEL CORONAVIRUS, NAA: SARS-CoV-2, NAA: NOT DETECTED

## 2020-02-22 LAB — SARS-COV-2, NAA 2 DAY TAT

## 2020-10-12 ENCOUNTER — Telehealth: Payer: Self-pay

## 2020-10-12 NOTE — Telephone Encounter (Signed)
Copied from CRM [id number]. Topic: Appointment Scheduling - Scheduling Inquiry for Clinic >> Oct 12, 2020 12:07 PM Leafy Ro wrote: Reason for CRM: Pt is calling and would like to come in to get tetanus shot only per pt last tetanus was aug 2010. Pt had puncture wound to her  index finger yesterday . Pt states index finger is just sore

## 2021-02-23 ENCOUNTER — Ambulatory Visit: Payer: Self-pay | Admitting: *Deleted

## 2021-02-23 DIAGNOSIS — U071 COVID-19: Secondary | ICD-10-CM

## 2021-02-23 MED ORDER — MOLNUPIRAVIR EUA 200MG CAPSULE: 4.0000 | ORAL_CAPSULE | Freq: Two times a day (BID) | ORAL | 0 refills | Status: AC

## 2021-02-23 NOTE — Telephone Encounter (Signed)
Please advise antiviral?

## 2021-02-23 NOTE — Telephone Encounter (Signed)
Prescription molnupiravir  has been sent to [facility] [place].

## 2021-02-23 NOTE — Telephone Encounter (Signed)
Pt advised.   Thanks,   -Gaylyn Berish  

## 2021-02-23 NOTE — Telephone Encounter (Signed)
Patient is calling to report she had + COVID testing at home this morning with symptoms that started last night. 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 Advised per COVID protocols isolation/treatment. Will send message to office for review- if [facility]. Please let her know.( Her husband is testing and will call with result)

## 2021-02-23 NOTE — Telephone Encounter (Signed)
Pt called with Covid, tested today.  She has chills and fever, body aches.  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  She wants to know if she is eligible for the treatments   CB#  [phone]  Reason for Disposition  [1] HIGH RISK for severe COVID complications (e.g., weak immune system, age > 64 years, obesity with BMI > 25, pregnant, chronic lung disease or other chronic medical condition) AND [2] COVID symptoms (e.g., cough, fever)  (Exceptions: Already seen by PCP and no new or worsening symptoms.)  Answer Assessment - Initial Assessment Questions 1. COVID-19 DIAGNOSIS: "Who made your COVID-19 diagnosis?" "Was it confirmed by a positive lab test or self-test?" If not diagnosed by a doctor (or NP/PA), ask "Are there lots of cases (community spread) where you live?" Note: See public health department website, if unsure.     + COVID home test 2. COVID-19 EXPOSURE: "Was there any known exposure to COVID before the symptoms began?" CDC Definition of close contact: within 6 feet (2 meters) for a total of 15 minutes or more over a 24-hour period.      Possible exposure to brother 3. ONSET: "When did the COVID-19 symptoms start?"      Today-chills 4. WORST SYMPTOM: "What is your worst symptom?" (e.g., cough, fever, shortness of breath, muscle aches)     Fever, body ache 5. COUGH: "Do you have a cough?" If Yes, ask: "How bad is the cough?"       little 6. FEVER: "Do you have a fever?" If Yes, ask: "What is your temperature, how was it measured, and when did it start?"     100.4 temporal- this morning 7. RESPIRATORY STATUS: "Describe your breathing?" (e.g., shortness of breath, wheezing, unable to speak)      Normal breathing- did get SOB with exertion 8. BETTER-SAME-WORSE: "Are you getting better, staying the same or getting worse compared to yesterday?"  If getting worse, ask, "In what way?"     Worse- developing symptoms 9. HIGH RISK DISEASE:  "Do you have any chronic medical problems?" (e.g., asthma, heart or lung disease, weak immune system, obesity, etc.)     no 10. VACCINE: "Have you had the COVID-19 vaccine?" If Yes, ask: "Which one, how many shots, when did you get it?"       Yes- pfizer  11. BOOSTER: "Have you received your COVID-19 booster?" If Yes, ask: "Which one and when did you get it?"       no 12. PREGNANCY: "Is there any chance you are pregnant?" "When was your last menstrual period?"       no 13. OTHER SYMPTOMS: "Do you have any other symptoms?"  (e.g., chills, fatigue, headache, loss of smell or taste, muscle pain, sore throat)       Chills, muscle pain, chills,headache 14. O2 SATURATION MONITOR:  "Do you use an oxygen saturation monitor (pulse oximeter) at home?" If Yes, ask "What is your reading (oxygen level) today?" "What is your usual oxygen saturation reading?" (e.g., 95%)       96%  Protocols used: Coronavirus (COVID-19) Diagnosed or Suspected-A-AH

## 2021-02-23 NOTE — Addendum Note (Signed)
Addended by: Mila Merry E on: 02/23/2021 12:17 PM   Modules accepted: Orders

## 2021-04-16 ENCOUNTER — Other Ambulatory Visit: Payer: Self-pay | Admitting: Physician Assistant

## 2021-04-16 DIAGNOSIS — B029 Zoster without complications: Secondary | ICD-10-CM

## 2021-04-16 NOTE — Telephone Encounter (Signed)
Pt called in to follow up on this Rx request since she is aware Dortha Kern is no longer at the practice and was not sure who would be able to send in this Rx, please advise.

## 2021-04-17 ENCOUNTER — Encounter: Payer: Self-pay | Admitting: *Deleted
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
## 2021-04-20 ENCOUNTER — Other Ambulatory Visit: Payer: Self-pay | Admitting: Family Medicine

## 2021-04-20 DIAGNOSIS — J40 Bronchitis, not specified as acute or chronic: Secondary | ICD-10-CM

## 2021-04-20 NOTE — Telephone Encounter (Signed)
[facility] request for the following medications:   albuterol (VENTOLIN HFA) 108 (90 Base) MCG/ACT inhaler    Please advise.  

## 2021-04-20 NOTE — Telephone Encounter (Signed)
Last refill: 02/20/2020 #1 with 2 refills  Last office visit: 02/20/2020 (virtual video visit for bronchitis)  Next office visit: 04/26/2021

## 2021-04-21 MED ORDER — ALBUTEROL SULFATE HFA 108 (90 BASE) MCG/ACT IN AERS: 2.0000 | INHALATION_SPRAY | Freq: Four times a day (QID) | RESPIRATORY_TRACT | 2 refills | Status: DC | PRN

## 2021-04-26 ENCOUNTER — Encounter: Payer: Self-pay | Admitting: Family Medicine

## 2021-04-26 ENCOUNTER — Telehealth: Payer: BLUE CROSS/BLUE SHIELD | Admitting: Family Medicine

## 2021-04-26 DIAGNOSIS — R21 Rash and other nonspecific skin eruption: Secondary | ICD-10-CM | POA: Diagnosis not present

## 2021-04-26 DIAGNOSIS — B029 Zoster without complications: Secondary | ICD-10-CM | POA: Diagnosis not present

## 2021-04-26 MED ORDER — VALACYCLOVIR HCL 1 G PO TABS: 1000.0000 mg | ORAL_TABLET | Freq: Three times a day (TID) | ORAL | 1 refills | Status: AC

## 2021-04-26 MED ORDER — KETOCONAZOLE 2 % EX CREA: 1.0000 "application " | TOPICAL_CREAM | Freq: Every day | CUTANEOUS | 0 refills | Status: AC

## 2021-04-26 NOTE — Progress Notes (Signed)
MyChart Video Visit    Virtual Visit via Video Note   This visit type was conducted due to national recommendations for restrictions regarding the COVID-19 Pandemic (e.g. social distancing) in an effort to limit this patient's exposure and mitigate transmission in our community. This patient is at least at moderate risk for complications without adequate follow up. This format is felt to be most appropriate for this patient at this time. Physical exam was limited by quality of the video and audio technology used for the visit.   Patient location: home Provider location: [facility]  I discussed the limitations of evaluation and management by telemedicine and the availability of in person appointments. The patient expressed understanding and agreed to proceed.  Patient: Raven Clark   DOB: Jul 12, 1960   60 y.o. Female  MRN: [id number] Visit Date: 04/26/2021  Today's healthcare provider: Mila Merry, MD   Chief Complaint  Patient presents with   Rash   Subjective    Rash This is a new problem. The current episode started 2 weeks ago. The problem is unchanged. States she had shingles about 2 1/2 years ago with another case about 1 year ago, similar to current rash but is now over both sides of scalp. Also has a patch along right side of abdomen. The rash is characterized by redness and itchiness (patient reports itching more on her face and scalp). She was exposed to nothing. Pertinent negatives include no anorexia, congestion, cough, diarrhea, eye pain, facial edema, fatigue, fever, joint pain, nail changes, rhinorrhea, shortness of breath, sore throat or vomiting. Treatments tried: Bactroban 2% and Aleve. The treatment provided no relief. Her past medical history is significant for varicella.      Medications: Outpatient Medications Prior to Visit  Medication Sig   albuterol (VENTOLIN HFA) 108 (90 Base) MCG/ACT inhaler Inhale 2 puffs into the lungs every 6 (six) hours as needed for  wheezing or shortness of breath.   ELDERBERRY PO Take by mouth every morning. gummies   mupirocin ointment (BACTROBAN) 2 % Apply 1 application topically 2 (two) times daily.   valACYclovir (VALTREX) 1000 MG tablet Take 1 tablet (1,000 mg total) by mouth 3 (three) times daily. (Patient not taking: Reported on 04/26/2021)   No facility-administered medications prior to visit.    Review of Systems  Constitutional:  Negative for fatigue and fever.  HENT:  Negative for congestion, rhinorrhea and sore throat.   Eyes:  Negative for pain.  Respiratory:  Negative for cough and shortness of breath.   Gastrointestinal:  Negative for diarrhea and vomiting.  Skin:  Positive for rash.     Objective    There were no vitals taken for this visit.   Physical Exam   Awake, alert, oriented x 3. In no apparent distress  Exam limited by video quality. Patches of mild erythema across scalp with sparse vesicular lesions. Diffuse area of erythema about 4-5cm wide right side of lower abdomen, no vesicles.  Assessment & Plan     1. Herpes zoster without complication  - valACYclovir (VALTREX) 1000 MG tablet; Take 1 tablet (1,000 mg total) by mouth 3 (three) times daily.  Dispense: 21 tablet; Refill: 1  2. Rash  - ketoconazole (NIZORAL) 2 % cream; Apply 1 application topically daily.  Dispense: 30 g; Refill: 0   Call if symptoms change or if not rapidly improving.        I discussed the assessment and treatment plan with the patient. The patient was provided an  opportunity to ask questions and all were answered. The patient agreed with the plan and demonstrated an understanding of the instructions.   The patient was advised to call back or seek an in-person evaluation if the symptoms worsen or if the condition fails to improve as anticipated.  I provided 9 minutes of non-face-to-face time during this encounter.  The entirety of the information documented in the History of Present Illness, Review of  Systems and Physical Exam were personally obtained by me. Portions of this information were initially documented by the CMA and reviewed by me for thoroughness and accuracy.    Mila Merry, MD [facility] [phone] (phone) [phone] (fax)  [facility]

## 2022-01-12 ENCOUNTER — Other Ambulatory Visit: Payer: Self-pay

## 2022-01-12 ENCOUNTER — Ambulatory Visit (INDEPENDENT_AMBULATORY_CARE_PROVIDER_SITE_OTHER): Payer: BLUE CROSS/BLUE SHIELD

## 2022-01-12 ENCOUNTER — Encounter: Payer: Self-pay | Admitting: Emergency Medicine

## 2022-01-12 ENCOUNTER — Ambulatory Visit
Admission: EM | Admit: 2022-01-12 | Discharge: 2022-01-12 | Disposition: A | Discharge status: Home / Self Care | Payer: BLUE CROSS/BLUE SHIELD | Attending: Family Medicine | Admitting: Family Medicine

## 2022-01-12 DIAGNOSIS — J029 Acute pharyngitis, unspecified: Secondary | ICD-10-CM | POA: Diagnosis present

## 2022-01-12 DIAGNOSIS — Z20822 Contact with and (suspected) exposure to covid-19: Secondary | ICD-10-CM | POA: Insufficient documentation

## 2022-01-12 DIAGNOSIS — J069 Acute upper respiratory infection, unspecified: Secondary | ICD-10-CM

## 2022-01-12 DIAGNOSIS — J40 Bronchitis, not specified as acute or chronic: Secondary | ICD-10-CM

## 2022-01-12 DIAGNOSIS — R059 Cough, unspecified: Secondary | ICD-10-CM

## 2022-01-12 LAB — SARS CORONAVIRUS 2 BY RT PCR: SARS Coronavirus 2 by RT PCR: NEGATIVE

## 2022-01-12 MED ORDER — LORATADINE 10 MG PO TABS: 10.0000 mg | ORAL_TABLET | Freq: Every day | ORAL | 0 refills | Status: AC

## 2022-01-12 MED ORDER — ALBUTEROL SULFATE HFA 108 (90 BASE) MCG/ACT IN AERS: 2.0000 | INHALATION_SPRAY | Freq: Four times a day (QID) | RESPIRATORY_TRACT | 2 refills | Status: AC | PRN

## 2022-01-12 NOTE — ED Triage Notes (Signed)
Pt c/o cough, sneezing, runny nose, sinus pain/pressure. Started over a week ago. She states the cough is keeping her up at night. Denies fever.

## 2022-01-12 NOTE — Progress Notes (Deleted)
     Established patient visit   Patient: NATALIA WITTMEYER   DOB: 1960/09/01   61 y.o. Female  MRN: [id number] Visit Date: 01/13/2022  Today's healthcare provider: Debera Lat, PA-C   No chief complaint on file.  Subjective    HPI  Patient is a 61 year old female who presents for evaluation of cough and congestion.    Medications: Outpatient Medications Prior to Visit  Medication Sig   albuterol (VENTOLIN HFA) 108 (90 Base) MCG/ACT inhaler Inhale 2 puffs into the lungs every 6 (six) hours as needed for wheezing or shortness of breath.   ELDERBERRY PO Take by mouth every morning. gummies   ketoconazole (NIZORAL) 2 % cream Apply 1 application topically daily.   mupirocin ointment (BACTROBAN) 2 % Apply 1 application topically 2 (two) times daily.   valACYclovir (VALTREX) 1000 MG tablet Take 1 tablet (1,000 mg total) by mouth 3 (three) times daily.   No facility-administered medications prior to visit.    Review of Systems  {Labs  Heme  Chem  Endocrine  Serology  Results Review (optional):23779}   Objective    There were no vitals taken for this visit. {Show previous vital signs (optional):23777}  Physical Exam  ***  No results found for any visits on 01/13/22.  Assessment & Plan     ***  No follow-ups on file.      {provider attestation***:1}   Debera Lat, Cordelia Poche  [facility] [phone] (phone) [phone] (fax)  [facility]

## 2022-01-12 NOTE — Discharge Instructions (Signed)

## 2022-01-12 NOTE — ED Provider Notes (Signed)
[facility]    CSN: [id number] Arrival date & time: 01/12/22  1452      History   Chief Complaint Chief Complaint  Patient presents with   Cough    HPI Raven Clark is a 61 y.o. female.   HPI   Raven Clark presents for ongoing cough since 01/02/2022.  Patient reports she was playing underneath a tree with her grandkids and noticed when she moved herself from underneath the tree she was covered in yellow pollen.  She has a history of allergies.  A couple days later she began to be congested and having sinus pressure.  She tried using her Nettie pot but reports nothing went through one nare.  She took Sudafed without much relief.  On Wednesday, she started coughing up phlegm whereas previously it was dry.  She reports symptoms are worse at night.  She has been coughing and gagging but not vomiting.  She tried using an inhaler and nebulizer but she is unsure if it helped.  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  Of note, her ex son-in-law has similar symptoms but she was only briefly around him.  She does smoke but does not have a history of asthma or COPD.   Fever : no  Chills: no Sore throat: Scratchy Cough: Yes Sputum: Yes Nasal congestion : Yes Rhinorrhea: no Myalgias: no Appetite: normal  Hydration: normal  Abdominal pain: no Nausea: Yes Vomiting: no Sleep disturbance: Yes Headache: yes but improving      History reviewed. No pertinent past medical history.  There are no problems to display for this patient.   Past Surgical History:  Procedure Laterality Date   FOOT SURGERY Right 1971    OB History   No obstetric history on file.      Home Medications    Prior to Admission medications   Medication Sig Start Date End Date Taking? Authorizing Provider  loratadine (CLARITIN) 10 MG tablet Take 1 tablet (10 mg total) by mouth daily. 01/12/22  Yes Andrews Tener, DO  albuterol (VENTOLIN HFA) 108  (90 Base) MCG/ACT inhaler Inhale 2 puffs into the lungs every 6 (six) hours as needed for wheezing or shortness of breath. 01/12/22   Pairlee Sawtell, DO  ELDERBERRY PO Take by mouth every morning. gummies    [provider]  ketoconazole (NIZORAL) 2 % cream Apply 1 application topically daily. 04/26/21   Malva Limes, MD  mupirocin ointment (BACTROBAN) 2 % Apply 1 application topically 2 (two) times daily. 01/25/19   Margaretann Loveless, PA-C  valACYclovir (VALTREX) 1000 MG tablet Take 1 tablet (1,000 mg total) by mouth 3 (three) times daily. 04/26/21   Malva Limes, MD    Family History Family History  Problem Relation Age of Onset   Hypertension Mother    Dementia Father    Parkinson's disease Father     Social History Social History   Tobacco Use   Smoking status: Every Day    Types: Cigarettes   Smokeless tobacco: Never  Vaping Use   Vaping Use: Never used  Substance Use Topics   Alcohol use: Yes    Alcohol/week: 1.0 standard drink of alcohol    Types: 1 Standard drinks or equivalent per week   Drug use: No     Allergies   Tetracycline   Review of Systems Review of Systems: :negative unless otherwise stated in HPI.  Physical Exam Triage Vital Signs ED Triage Vitals  Enc Vitals Group     BP 01/12/22 1518 (!) 152/91     Pulse Rate 01/12/22 1518 80     Resp 01/12/22 1518 16     Temp 01/12/22 1518 98.5 F (36.9 C)     Temp Source 01/12/22 1518 Oral     SpO2 01/12/22 1518 96 %     Weight 01/12/22 1517 156 lb 1.4 oz (70.8 kg)     Height 01/12/22 1517 5' 2.5" (1.588 m)     Head Circumference --      Peak Flow --      Pain Score 01/12/22 1516 2     Pain Loc --      Pain Edu? --      Excl. in [place]? --    No data found.  Updated Vital Signs BP (!) 152/91 (BP Location: Right Arm)   Pulse 80   Temp 98.5 F (36.9 C) (Oral)   Resp 16   Ht 5' 2.5" (1.588 m)   Wt 70.8 kg   SpO2 96%   BMI 28.09 kg/m   Visual Acuity Right Eye Distance:    Left Eye Distance:   Bilateral Distance:    Right Eye Near:   Left Eye Near:    Bilateral Near:     Physical Exam GEN:     alert, non-toxic appearing female in no distress    HENT:  mucus membranes moist, oropharyngeal without lesions or exudate, no tonsillar hypertrophy, mild erythema , erythematous moderate turbinate hypertrophy, clear clear nasal discharge, bilateral TM normal EYES:   pupils equal and reactive, no scleral injection NECK:  normal ROM, anterior lymphadenopathy RESP:  no increased work of breathing, clear to auscultation bilaterally,   CVS:   regular rate and rhythm Skin:   warm and dry   UC Treatments / Results  Labs (all labs ordered are listed, but only abnormal results are displayed) Labs Reviewed  SARS CORONAVIRUS 2 BY RT PCR    EKG   Radiology DG Chest 2 View  Result Date: 01/12/2022 CLINICAL DATA:  Cough EXAM: CHEST - 2 VIEW COMPARISON:  Chest 11/14/2017 FINDINGS: Heart size and vascularity normal. Negative for infiltrate or effusion. Mild right middle lobe atelectasis on the lateral view. No acute skeletal abnormality. IMPRESSION: No active cardiopulmonary disease. Electronically Signed   By: Marlan Palau M.D.   On: 01/12/2022 16:49    Procedures Procedures (including critical care time)  Medications Ordered in UC Medications - No data to display  Initial Impression / Assessment and Plan / UC Course  I have reviewed the triage vital signs and the nursing notes.  Pertinent labs & imaging results that were available during my care of the patient were reviewed by me and considered in my medical decision making (see chart for details).     Patient is a 61 year old smoker who presents with cough and other respiratory symptoms for the past week.  Cardiopulmonary exam is unremarkable.  She is satting well on room air.  Chest x-ray obtained and was unremarkable, personally reviewed by me. History consistent with viral respiratory illness. Overall pt  is well appearing, well hydrated, without respiratory distress. Discussed symptomatic treatment. COVID testing obtained and was negative.  On chart review, patient had COVID in October 2022.  Explained lack of efficacy of antibiotics in viral disease. - continue Tylenol/ Motrin as needed for discomfort - nasal saline to help with his nasal congestion - Use a mist  humidifier to help with breathing - Stressed importance of hydration - Albuterol to help with chest tightness   - Refilled Claritin - Discussed return and ED precautions, understanding voiced   Final Clinical Impressions(s) / UC Diagnoses   Final diagnoses:  Viral URI with cough     Discharge Instructions      We will contact you if your COVID test is positive.  Please quarantine while you wait for the results.  If your test is negative you may resume normal activities.  If your test is positive please continue to quarantine for at least 5 days from your symptom onset or until you are without a fever for at least 24 hours after the medications.   You can take Tylenol and/or Ibuprofen as needed for fever reduction and pain relief.    For cough: honey 1/2 to 1 teaspoon (you can dilute the honey in water or another fluid).  You can also use guaifenesin and dextromethorphan for cough. You can use a humidifier for chest congestion and cough.  If you don't have a humidifier, you can sit in the bathroom with the hot shower running.      For sore throat: try warm salt water gargles, cepacol lozenges, throat spray, warm tea or water with lemon/honey, popsicles or ice, or OTC cold relief medicine for throat discomfort.    For congestion: take a daily anti-histamine like Zyrtec, Claritin, and a oral decongestant, such as pseudoephedrine.  You can also use Flonase 1-2 sprays in each nostril daily.    It is important to stay hydrated: drink plenty of fluids (water, gatorade/powerade/pedialyte, juices, or teas) to keep your throat  moisturized and help further relieve irritation/discomfort.    Return or go to the Emergency Department if symptoms worsen or do not improve in the next few days      ED Prescriptions     Medication Sig Dispense Auth. Provider   loratadine (CLARITIN) 10 MG tablet Take 1 tablet (10 mg total) by mouth daily. 30 tablet Shelby Peltz, DO   albuterol (VENTOLIN HFA) 108 (90 Base) MCG/ACT inhaler Inhale 2 puffs into the lungs every 6 (six) hours as needed for wheezing or shortness of breath. 1 each Katha Cabal, DO      PDMP not reviewed this encounter.   Katha Cabal, DO 01/13/22 1026

## 2022-01-13 ENCOUNTER — Ambulatory Visit: Payer: BLUE CROSS/BLUE SHIELD | Admitting: Physician Assistant

## 2022-08-16 ENCOUNTER — Ambulatory Visit
Admission: EM | Admit: 2022-08-16 | Discharge: 2022-08-16 | Disposition: A | Discharge status: Home / Self Care | Payer: BLUE CROSS/BLUE SHIELD | Attending: Emergency Medicine | Admitting: Emergency Medicine

## 2022-08-16 ENCOUNTER — Other Ambulatory Visit: Payer: Self-pay

## 2022-08-16 DIAGNOSIS — J069 Acute upper respiratory infection, unspecified: Secondary | ICD-10-CM | POA: Insufficient documentation

## 2022-08-16 LAB — GROUP A STREP BY PCR: Group A Strep by PCR: NOT DETECTED

## 2022-08-16 NOTE — ED Triage Notes (Signed)
Pt here for sore throat fever that started today with cough and nasal drainage.

## 2022-08-16 NOTE — Discharge Instructions (Addendum)
Your symptoms today are most likely being caused by a virus and should steadily improve in time it can take up to 7 to 10 days before you truly start to see a turnaround however things will get better  Could possible be your allergies, if you continue to feel feverish or begin to have measured fevers then it is a viral germ  Testing today is negative  If you decide you would like a COVID test she may complete a home test, if positive current guidelines are to quarantine until 24 hours without fever    You can take Tylenol and/or Ibuprofen as needed for fever reduction and pain relief.   For cough: honey 1/2 to 1 teaspoon (you can dilute the honey in water or another fluid).  You can also use guaifenesin and dextromethorphan for cough. You can use a humidifier for chest congestion and cough.  If you don't have a humidifier, you can sit in the bathroom with the hot shower running.      For sore throat: try warm salt water gargles, cepacol lozenges, throat spray, warm tea or water with lemon/honey, popsicles or ice, or OTC cold relief medicine for throat discomfort.   For congestion: take a daily anti-histamine like Zyrtec, Claritin, and a oral decongestant, such as pseudoephedrine.  You can also use Flonase 1-2 sprays in each nostril daily.   It is important to stay hydrated: drink plenty of fluids (water, gatorade/powerade/pedialyte, juices, or teas) to keep your throat moisturized and help further relieve irritation/discomfort.

## 2022-08-16 NOTE — ED Provider Notes (Signed)
[facility]    CSN: [id number] Arrival date & time: 08/16/22  1907      History   Chief Complaint Chief Complaint  Patient presents with   Sore Throat    HPI Raven Clark is a 62 y.o. female.   Patient presents for evaluation of feeling feverish, nasal congestion, rhinorrhea, sore throat and nonproductive cough beginning today.  Known sick contact with influenza 3 weeks ago, GI stomach bug 1 week ago.  Decreased appetite but tolerating food and liquids.  Has attempted use of antihistamine.  Denies shortness of breath or wheezing.   History reviewed. No pertinent past medical history.  There are no problems to display for this patient.   Past Surgical History:  Procedure Laterality Date   FOOT SURGERY Right 1971    OB History   No obstetric history on file.      Home Medications    Prior to Admission medications   Medication Sig Start Date End Date Taking? Authorizing Provider  albuterol (VENTOLIN HFA) 108 (90 Base) MCG/ACT inhaler Inhale 2 puffs into the lungs every 6 (six) hours as needed for wheezing or shortness of breath. 01/12/22   Brimage, Vondra, DO  ELDERBERRY PO Take by mouth every morning. gummies    [provider]  ketoconazole (NIZORAL) 2 % cream Apply 1 application topically daily. 04/26/21   Birdie Sons, MD  loratadine (CLARITIN) 10 MG tablet Take 1 tablet (10 mg total) by mouth daily. 01/12/22   Lyndee Hensen, DO  mupirocin ointment (BACTROBAN) 2 % Apply 1 application topically 2 (two) times daily. 01/25/19   Mar Daring, PA-C  valACYclovir (VALTREX) 1000 MG tablet Take 1 tablet (1,000 mg total) by mouth 3 (three) times daily. 04/26/21   Birdie Sons, MD    Family History Family History  Problem Relation Age of Onset   Hypertension Mother    Dementia Father    Parkinson's disease Father     Social History Social History   Tobacco Use   Smoking status: Every Day    Types: Cigarettes   Smokeless  tobacco: Never  Vaping Use   Vaping Use: Never used  Substance Use Topics   Alcohol use: Yes    Alcohol/week: 1.0 standard drink of alcohol    Types: 1 Standard drinks or equivalent per week   Drug use: No     Allergies   Tetracycline   Review of Systems Review of Systems  Constitutional: Negative.   HENT:  Positive for congestion, rhinorrhea and sore throat. Negative for dental problem, drooling, ear discharge, ear pain, facial swelling, hearing loss, mouth sores, nosebleeds, postnasal drip, sinus pressure, sinus pain, sneezing, tinnitus, trouble swallowing and voice change.   Respiratory:  Positive for cough. Negative for apnea, choking, chest tightness, shortness of breath, wheezing and stridor.   Cardiovascular: Negative.   Gastrointestinal: Negative.   Skin: Negative.   Neurological: Negative.      Physical Exam Triage Vital Signs ED Triage Vitals  Enc Vitals Group     BP 08/16/22 1921 (!) 141/83     Pulse Rate 08/16/22 1921 74     Resp 08/16/22 1921 20     Temp 08/16/22 1921 98.2 F (36.8 C)     Temp src --      SpO2 08/16/22 1921 100 %     Weight --      Height --      Head Circumference --      Peak Flow --  Pain Score 08/16/22 1922 3     Pain Loc --      Pain Edu? --      Excl. in [place]? --    No data found.  Updated Vital Signs BP (!) 141/83   Pulse 74   Temp 98.2 F (36.8 C)   Resp 20   SpO2 100%   Visual Acuity Right Eye Distance:   Left Eye Distance:   Bilateral Distance:    Right Eye Near:   Left Eye Near:    Bilateral Near:     Physical Exam Constitutional:      Appearance: She is well-developed.  HENT:     Head: Normocephalic.     Right Ear: Tympanic membrane and ear canal normal.     Left Ear: Tympanic membrane and ear canal normal.     Nose: Congestion and rhinorrhea present.     Mouth/Throat:     Mouth: Mucous membranes are moist.     Pharynx: No posterior oropharyngeal erythema.     Tonsils: No tonsillar exudate. 0 on  the right. 0 on the left.  Cardiovascular:     Rate and Rhythm: Normal rate and regular rhythm.  Pulmonary:     Effort: Pulmonary effort is normal.     Breath sounds: Normal breath sounds.  Skin:    General: Skin is warm and dry.  Neurological:     General: No focal deficit present.     Mental Status: She is alert and oriented to person, place, and time.      UC Treatments / Results  Labs (all labs ordered are listed, but only abnormal results are displayed) Labs Reviewed  GROUP A STREP BY PCR    EKG   Radiology No results found.  Procedures Procedures (including critical care time)  Medications Ordered in UC Medications - No data to display  Initial Impression / Assessment and Plan / UC Course  I have reviewed the triage vital signs and the nursing notes.  Pertinent labs & imaging results that were available during my care of the patient were reviewed by me and considered in my medical decision making (see chart for details).  Viral URI with cough  Patient is in no signs of distress nor toxic appearing.  Vital signs are stable.  Low suspicion for pneumonia, pneumothorax or bronchitis and therefore will defer imaging.  Strep test negative.  Declined COVID testing, recommended home testing if she changes her mind.May use additional over-the-counter medications as needed for supportive care.  May follow-up with urgent care as needed if symptoms persist or worsen.  Final Clinical Impressions(s) / UC Diagnoses   Final diagnoses:  None   Discharge Instructions   None    ED Prescriptions   None    PDMP not reviewed this encounter.   Hans Eden, NP 08/16/22 2003

## 2024-05-01 VITALS — BP 140/93 | HR 69 | Temp 98.5°F | Resp 17 | Wt 160.0 lb

## 2024-05-01 DIAGNOSIS — J0110 Acute frontal sinusitis, unspecified: Secondary | ICD-10-CM | POA: Diagnosis not present

## 2024-05-01 DIAGNOSIS — F17200 Nicotine dependence, unspecified, uncomplicated: Secondary | ICD-10-CM

## 2024-05-01 DIAGNOSIS — J209 Acute bronchitis, unspecified: Secondary | ICD-10-CM | POA: Diagnosis not present

## 2024-05-01 MED ORDER — AMOXICILLIN-POT CLAVULANATE 875-125 MG PO TABS: 1.0000 | ORAL_TABLET | Freq: Two times a day (BID) | ORAL | 0 refills | Status: AC

## 2024-05-01 MED ORDER — PROMETHAZINE-DM 6.25-15 MG/5ML PO SYRP: 5.0000 mL | ORAL_SOLUTION | Freq: Four times a day (QID) | ORAL | 0 refills | Status: AC | PRN

## 2024-05-01 MED ORDER — PREDNISONE 50 MG PO TABS: ORAL_TABLET | ORAL | 0 refills | Status: AC

## 2024-05-01 NOTE — ED Triage Notes (Signed)
 Patient states that cough started Sat. Sinus pressure and drainage 11 days.  Mucixex Breathing treatment TID  Tylenol and IBU

## 2024-05-01 NOTE — ED Provider Notes (Signed)
 [facility]    CSN: [id number] Arrival date & time: 05/01/24  0910      History   Chief Complaint Chief Complaint  Patient presents with   Cough    Entered by patient    HPI Raven Clark is a 63 y.o. female.   63 year old female, Raven Clark, presents to urgent care for cough x 11 days.  Patient states she has used Mucinex breathing treatment Tylenol and ibuprofen  without relief.   Unknown illness exposure  The history is provided by the patient. No language interpreter was used.    History reviewed. No pertinent past medical history.  Patient Active Problem List   Diagnosis Date Noted   Smoker 05/01/2024   Acute bronchitis 05/01/2024   Acute frontal sinusitis 05/01/2024    Past Surgical History:  Procedure Laterality Date   FOOT SURGERY Right 1971    OB History   No obstetric history on file.      Home Medications    Prior to Admission medications   Medication Sig Start Date End Date Taking? Authorizing Provider  amoxicillin-clavulanate (AUGMENTIN) 875-125 MG tablet Take 1 tablet by mouth every 12 (twelve) hours. 05/01/24  Yes [place], NP  predniSONE  (DELTASONE ) 50 MG tablet Take 1 tab po daily 05/01/24  Yes Karyl Sharrar, Rilla, NP  promethazine-dextromethorphan (PROMETHAZINE-DM) 6.25-15 MG/5ML syrup Take 5 mLs by mouth 4 (four) times daily as needed for cough. 05/01/24  Yes Rilley Poulter, Rilla, NP  albuterol  (VENTOLIN  HFA) 108 (90 Base) MCG/ACT inhaler Inhale 2 puffs into the lungs every 6 (six) hours as needed for wheezing or shortness of breath. 01/12/22   Brimage, Vondra, DO  ELDERBERRY PO Take by mouth every morning. gummies    [provider]  ketoconazole  (NIZORAL ) 2 % cream Apply 1 application topically daily. 04/26/21   Gasper Nancyann BRAVO, MD  loratadine  (CLARITIN ) 10 MG tablet Take 1 tablet (10 mg total) by mouth daily. 01/12/22   Brimage, Vondra, DO  mupirocin  ointment (BACTROBAN ) 2 % Apply 1 application topically 2  (two) times daily. 01/25/19   Vivienne Delon HERO, PA-C  valACYclovir  (VALTREX ) 1000 MG tablet Take 1 tablet (1,000 mg total) by mouth 3 (three) times daily. 04/26/21   Gasper Nancyann BRAVO, MD    Family History Family History  Problem Relation Age of Onset   Hypertension Mother    Dementia Father    Parkinson's disease Father     Social History Social History   Tobacco Use   Smoking status: Every Day    Types: Cigarettes   Smokeless tobacco: Never  Vaping Use   Vaping status: Never Used  Substance Use Topics   Alcohol use: Yes    Alcohol/week: 1.0 standard drink of alcohol    Types: 1 Standard drinks or equivalent per week   Drug use: No     Allergies   Tetracycline   Review of Systems Review of Systems  Constitutional:  Negative for fever.  HENT:  Positive for postnasal drip, sinus pressure and sinus pain.   Respiratory:  Positive for cough.   Cardiovascular:  Negative for chest pain and palpitations.  All other systems reviewed and are negative.    Physical Exam Triage Vital Signs ED Triage Vitals  Encounter Vitals Group     BP      Girls Systolic BP Percentile      Girls Diastolic BP Percentile      Boys Systolic BP Percentile      Boys Diastolic BP Percentile  Pulse      Resp      Temp      Temp src      SpO2      Weight      Height      Head Circumference      Peak Flow      Pain Score      Pain Loc      Pain Education      Exclude from Growth Chart    No data found.  Updated Vital Signs BP (!) 140/93 (BP Location: Left Arm)   Pulse 69   Temp 98.5 F (36.9 C) (Oral)   Resp 17   Wt 160 lb (72.6 kg)   SpO2 98%   BMI 28.80 kg/m   Visual Acuity Right Eye Distance:   Left Eye Distance:   Bilateral Distance:    Right Eye Near:   Left Eye Near:    Bilateral Near:     Physical Exam Vitals and nursing note reviewed.  Constitutional:      General: She is not in acute distress.    Appearance: She is well-developed.  HENT:      Head: Normocephalic.     Right Ear: Tympanic membrane is retracted.     Left Ear: Tympanic membrane is retracted.     Nose: Mucosal edema and congestion present.     Right Sinus: Maxillary sinus tenderness and frontal sinus tenderness present.     Left Sinus: Maxillary sinus tenderness and frontal sinus tenderness present.     Mouth/Throat:     Lips: Pink.     Mouth: Mucous membranes are moist.     Pharynx: Oropharynx is clear.  Eyes:     General: Lids are normal.     Conjunctiva/sclera: Conjunctivae normal.     Pupils: Pupils are equal, round, and reactive to light.  Neck:     Trachea: No tracheal deviation.  Cardiovascular:     Rate and Rhythm: Normal rate and regular rhythm.     Heart sounds: Normal heart sounds. No murmur heard. Pulmonary:     Effort: Pulmonary effort is normal.     Breath sounds: Normal breath sounds and air entry.  Abdominal:     General: Bowel sounds are normal.     Palpations: Abdomen is soft.     Tenderness: There is no abdominal tenderness.  Musculoskeletal:        General: Normal range of motion.     Cervical back: Normal range of motion.  Lymphadenopathy:     Cervical: No cervical adenopathy.  Skin:    General: Skin is warm and dry.     Findings: No rash.  Neurological:     General: No focal deficit present.     Mental Status: She is alert and oriented to person, place, and time.     GCS: GCS eye subscore is 4. GCS verbal subscore is 5. GCS motor subscore is 6.  Psychiatric:        Attention and Perception: Attention normal.        Speech: Speech normal.        Behavior: Behavior normal. Behavior is cooperative.      UC Treatments / Results  Labs (all labs ordered are listed, but only abnormal results are displayed) Labs Reviewed - No data to display  EKG   Radiology No results found.  Procedures Procedures (including critical care time)  Medications Ordered in UC Medications - No data to display  Initial  Impression /  Assessment and Plan / UC Course  I have reviewed the triage vital signs and the nursing notes.  Pertinent labs & imaging results that were available during my care of the patient were reviewed by me and considered in my medical decision making (see chart for details).    Discussed exam findings and plan of care with patient, phenergan Dm,Augmentin,prednisone  scripted, strict go to ER precautions given.   Patient verbalized understanding to this provider.  Ddx: Acute bronchitis, sinusitis, viral illness, allergies,smoker Final Clinical Impressions(s) / UC Diagnoses   Final diagnoses:  Acute bronchitis, unspecified organism  Smoker  Acute frontal sinusitis, recurrence not specified     Discharge Instructions      Stop smoking Take meds as directed(Augmentin,prednisone , phenergan DM).  Push fluids, avoid caffeine If you develop chest pain, palpitations, shortness of breath or worsening issues go to Er for further evaluation.      ED Prescriptions     Medication Sig Dispense Auth. Provider   promethazine-dextromethorphan (PROMETHAZINE-DM) 6.25-15 MG/5ML syrup Take 5 mLs by mouth 4 (four) times daily as needed for cough. 118 mL Mateo Overbeck, NP   predniSONE  (DELTASONE ) 50 MG tablet Take 1 tab po daily 5 tablet Stevee Valenta, NP   amoxicillin-clavulanate (AUGMENTIN) 875-125 MG tablet Take 1 tablet by mouth every 12 (twelve) hours. 14 tablet Artez Regis, Rilla, NP      PDMP not reviewed this encounter.   Aminta Rilla, NP 05/01/24 [phone]

## 2024-05-01 NOTE — Discharge Instructions (Addendum)
 Stop smoking Take meds as directed(Augmentin,prednisone , phenergan DM).  Push fluids, avoid caffeine If you develop chest pain, palpitations, shortness of breath or worsening issues go to Er for further evaluation.
